# Patient Record
Sex: Female | Born: 1965 | Race: White | Hispanic: No | Marital: Married | Smoking: Former smoker
Health system: Southern US, Community
[De-identification: ages and names within clinical notes are randomized; demographics above are authoritative.]

## PROBLEM LIST (undated history)

## (undated) HISTORY — PX: TUBAL LIGATION: SHX77

## (undated) HISTORY — PX: DILATION AND CURETTAGE OF UTERUS: SHX78

---

## 2010-11-19 ENCOUNTER — Emergency Department (HOSPITAL_COMMUNITY)
Admission: EM | Admit: 2010-11-19 | Discharge: 2010-11-19 | Disposition: A | Discharge status: Home / Self Care | Payer: BC Managed Care – PPO | Attending: Emergency Medicine | Admitting: Emergency Medicine

## 2010-11-19 DIAGNOSIS — W64XXXA Exposure to other animate mechanical forces, initial encounter: Secondary | ICD-10-CM | POA: Insufficient documentation

## 2010-11-19 DIAGNOSIS — S058X9A Other injuries of unspecified eye and orbit, initial encounter: Secondary | ICD-10-CM | POA: Insufficient documentation

## 2011-09-08 ENCOUNTER — Emergency Department (HOSPITAL_COMMUNITY)
Admission: EM | Admit: 2011-09-08 | Discharge: 2011-09-09 | Disposition: A | Discharge status: Home / Self Care | Payer: BC Managed Care – PPO | Attending: Emergency Medicine | Admitting: Emergency Medicine

## 2011-09-08 ENCOUNTER — Encounter (HOSPITAL_COMMUNITY): Payer: Self-pay

## 2011-09-08 DIAGNOSIS — K5289 Other specified noninfective gastroenteritis and colitis: Secondary | ICD-10-CM | POA: Insufficient documentation

## 2011-09-08 DIAGNOSIS — K529 Noninfective gastroenteritis and colitis, unspecified: Secondary | ICD-10-CM

## 2011-09-08 DIAGNOSIS — R10819 Abdominal tenderness, unspecified site: Secondary | ICD-10-CM | POA: Insufficient documentation

## 2011-09-08 DIAGNOSIS — R109 Unspecified abdominal pain: Secondary | ICD-10-CM | POA: Insufficient documentation

## 2011-09-08 LAB — CBC
Hemoglobin: 15.5 g/dL — ABNORMAL HIGH (ref 12.0–15.0)
MCHC: 34 g/dL (ref 30.0–36.0)
RBC: 5.1 MIL/uL (ref 3.87–5.11)

## 2011-09-08 LAB — DIFFERENTIAL
Basophils Relative: 0 % (ref 0–1)
Monocytes Relative: 2 % — ABNORMAL LOW (ref 3–12)
Neutro Abs: 10.9 10*3/uL — ABNORMAL HIGH (ref 1.7–7.7)
Neutrophils Relative %: 91 % — ABNORMAL HIGH (ref 43–77)

## 2011-09-08 LAB — URINALYSIS, ROUTINE W REFLEX MICROSCOPIC
Glucose, UA: NEGATIVE mg/dL
Hgb urine dipstick: NEGATIVE
Ketones, ur: >80 mg/dL — AB
pH: 5.5 (ref 5.0–8.0)

## 2011-09-08 MED ORDER — SODIUM CHLORIDE 0.9 % IV SOLN
Freq: Once | INTRAVENOUS | Status: AC
  Administered 2011-09-08: via INTRAVENOUS

## 2011-09-08 MED ORDER — KETOROLAC TROMETHAMINE 30 MG/ML IJ SOLN
30.0000 mg | Freq: Once | INTRAMUSCULAR | Status: AC
  Administered 2011-09-08: 30 mg via INTRAVENOUS
  Filled 2011-09-08: qty 1

## 2011-09-08 MED ORDER — ONDANSETRON HCL 4 MG/2ML IJ SOLN
4.0000 mg | Freq: Once | INTRAMUSCULAR | Status: AC
  Administered 2011-09-08: 4 mg via INTRAVENOUS
  Filled 2011-09-08: qty 2

## 2011-09-08 NOTE — ED Provider Notes (Signed)
History   This chart was scribed for Geoffery Lyons, MD by Charolett Bumpers . The patient was seen in room APA10/[facility]10 and the patient's care was started at 11:09pm.   CSN: [id number]  Arrival date & time 09/08/11  2033   First MD Initiated Contact with Patient 09/08/11 2258      Chief Complaint  Patient presents with  . Vomiting  . Diarrhea  . Abdominal Pain  . Fever    (Consider location/radiation/quality/duration/timing/severity/associated sxs/prior treatment) The history is provided by the patient.   Adriana Weiss is a 46 y.o. female who presents to the Emergency Department complaining of constant, moderate generalized abdominal pain with associated n/v/d and fever since yesterday. Patient also reports decreased intake due to the inability to keep anything down. Patient denies any blood in stool or vomit. Patient reports some dysuria. Patient states that her abdominal pain is not localized. Patient states that she has a h/o gall bladder problems. Patient denies any known sick contacts.   No PCP   History reviewed. No pertinent past medical history.  Past Surgical History  Procedure Date  . Tubal ligation   . Dilation and curettage of uterus     No family history on file.  History  Substance Use Topics  . Smoking status: Current Everyday Smoker -- 1.5 packs/day  . Smokeless tobacco: Not on file  . Alcohol Use: No    OB History    Grav Para Term Preterm Abortions TAB SAB Ect Mult Living                  Review of Systems A complete 10 system review of systems was obtained and all systems are negative except as noted in the HPI and PMH.   Allergies  Erythromycin and Latex  Home Medications   Current Outpatient Rx  Name Route Sig Dispense Refill  . SIMETHICONE 125 MG PO CHEW Oral Chew 125 mg by mouth as needed. FOR CRAMPS AND BLOATING      BP 149/99  Pulse 121  Temp(Src) 97.8 F (36.6 C) (Oral)  Resp 22  Ht 5\' 6"  (1.676 m)  Wt 175 lb (79.379 kg)   BMI 28.25 kg/m2  SpO2 99%  LMP 08/08/2011  Physical Exam  Nursing note and vitals reviewed. Constitutional: She is oriented to person, place, and time. She appears well-developed and well-nourished. No distress.  HENT:  Head: Normocephalic and atraumatic.  Right Ear: External ear normal.  Left Ear: External ear normal.  Nose: Nose normal.  Mouth/Throat: Oropharynx is clear and moist.  Eyes: Conjunctivae and EOM are normal. Pupils are equal, round, and reactive to light.  Neck: Normal range of motion. Neck supple. No tracheal deviation present.  Cardiovascular: Normal rate, regular rhythm and normal heart sounds.  Exam reveals no gallop and no friction rub.   No murmur heard. Pulmonary/Chest: Effort normal and breath sounds normal. No respiratory distress. She has no wheezes.  Abdominal: Soft. Bowel sounds are normal. She exhibits no distension. There is tenderness. There is no rebound and no guarding.       Tenderness with palpitations in all quadrants.   Musculoskeletal: Normal range of motion. She exhibits no edema.  Neurological: She is alert and oriented to person, place, and time. No sensory deficit.  Skin: Skin is warm and dry.       Good skin turgor.   Psychiatric: She has a normal mood and affect. Her behavior is normal.    ED Course  Procedures (including critical  care time)  DIAGNOSTIC STUDIES: Oxygen Saturation is 99% on room air, normal by my interpretation.    COORDINATION OF CARE:  2314: Discussed planned course of treatment with the patient, who is agreeable at this time. Will order labs to determine if gall bladder related. Will order nausea and pain medication and IV fluids.  2315: Medication Orders: Ondansetron injection 4 mg-once; Ketorolac 30 mg/mL injection 30 mg-once; 0.9% sodium chloride infusion-once   Labs Reviewed - No data to display No results found.   No diagnosis found.    MDM  The labs look okay.  More likely AGE than biliary colic.   Will discharge to home with meds for pain and nausea.   I personally performed the services described in this documentation, which was scribed in my presence. The recorded information has been reviewed and considered.      Geoffery Lyons, MD 09/09/11 [phone]

## 2011-09-08 NOTE — ED Notes (Signed)
Pt presents with abdominal pain, n/v/d, and fever since yesterday.

## 2011-09-09 LAB — COMPREHENSIVE METABOLIC PANEL
ALT: 12 U/L (ref 0–35)
AST: 19 U/L (ref 0–37)
Albumin: 3.9 g/dL (ref 3.5–5.2)
Alkaline Phosphatase: 82 U/L (ref 39–117)
BUN: 18 mg/dL (ref 6–23)
Chloride: 102 mEq/L (ref 96–112)
Potassium: 4.2 mEq/L (ref 3.5–5.1)
Sodium: 137 mEq/L (ref 135–145)
Total Bilirubin: 0.3 mg/dL (ref 0.3–1.2)

## 2011-09-09 LAB — LIPASE, BLOOD: Lipase: 19 U/L (ref 11–59)

## 2011-09-09 MED ORDER — PROMETHAZINE HCL 25 MG PO TABS: 25.0000 mg | ORAL_TABLET | Freq: Four times a day (QID) | ORAL | Status: AC | PRN

## 2011-09-09 MED ORDER — ONDANSETRON HCL 4 MG/2ML IJ SOLN
INTRAMUSCULAR | Status: AC
  Filled 2011-09-09: qty 2

## 2011-09-09 MED ORDER — MORPHINE SULFATE 4 MG/ML IJ SOLN
4.0000 mg | Freq: Once | INTRAMUSCULAR | Status: AC
  Administered 2011-09-09: 4 mg via INTRAVENOUS

## 2011-09-09 MED ORDER — HYDROCODONE-ACETAMINOPHEN 5-500 MG PO TABS: 1.0000 | ORAL_TABLET | Freq: Four times a day (QID) | ORAL | Status: AC | PRN

## 2011-09-09 MED ORDER — ONDANSETRON HCL 4 MG/2ML IJ SOLN
4.0000 mg | Freq: Once | INTRAMUSCULAR | Status: AC
  Administered 2011-09-09: 4 mg via INTRAVENOUS

## 2011-09-09 MED ORDER — MORPHINE SULFATE 4 MG/ML IJ SOLN
INTRAMUSCULAR | Status: AC
  Filled 2011-09-09: qty 1

## 2011-09-09 NOTE — Discharge Instructions (Signed)

## 2011-09-09 NOTE — ED Notes (Signed)
Gave patient mouth swabs and cup of water for dry mouth as requested.

## 2017-10-28 ENCOUNTER — Emergency Department (HOSPITAL_COMMUNITY): Payer: PRIVATE HEALTH INSURANCE

## 2017-10-28 ENCOUNTER — Encounter (HOSPITAL_COMMUNITY): Payer: Self-pay

## 2017-10-28 ENCOUNTER — Emergency Department (HOSPITAL_COMMUNITY)
Admission: EM | Admit: 2017-10-28 | Discharge: 2017-10-28 | Disposition: A | Discharge status: Home / Self Care | Payer: PRIVATE HEALTH INSURANCE | Attending: Emergency Medicine | Admitting: Emergency Medicine

## 2017-10-28 ENCOUNTER — Other Ambulatory Visit: Payer: Self-pay

## 2017-10-28 DIAGNOSIS — Z87891 Personal history of nicotine dependence: Secondary | ICD-10-CM | POA: Insufficient documentation

## 2017-10-28 DIAGNOSIS — Y92411 Interstate highway as the place of occurrence of the external cause: Secondary | ICD-10-CM | POA: Diagnosis not present

## 2017-10-28 DIAGNOSIS — S40021A Contusion of right upper arm, initial encounter: Secondary | ICD-10-CM | POA: Insufficient documentation

## 2017-10-28 DIAGNOSIS — S8001XA Contusion of right knee, initial encounter: Secondary | ICD-10-CM | POA: Diagnosis not present

## 2017-10-28 DIAGNOSIS — S4991XA Unspecified injury of right shoulder and upper arm, initial encounter: Secondary | ICD-10-CM | POA: Diagnosis present

## 2017-10-28 DIAGNOSIS — Y939 Activity, unspecified: Secondary | ICD-10-CM | POA: Insufficient documentation

## 2017-10-28 DIAGNOSIS — Y999 Unspecified external cause status: Secondary | ICD-10-CM | POA: Insufficient documentation

## 2017-10-28 MED ORDER — CYCLOBENZAPRINE HCL 10 MG PO TABS
10.0000 mg | ORAL_TABLET | Freq: Once | ORAL | Status: AC
  Administered 2017-10-28: 10 mg via ORAL
  Filled 2017-10-28: qty 1

## 2017-10-28 MED ORDER — KETOROLAC TROMETHAMINE 60 MG/2ML IM SOLN
60.0000 mg | Freq: Once | INTRAMUSCULAR | Status: AC
  Administered 2017-10-28: 60 mg via INTRAMUSCULAR
  Filled 2017-10-28: qty 2

## 2017-10-28 MED ORDER — NAPROXEN 500 MG PO TABS: ORAL_TABLET | ORAL | 0 refills | Status: AC

## 2017-10-28 MED ORDER — TRAMADOL HCL 50 MG PO TABS: 100.0000 mg | ORAL_TABLET | Freq: Four times a day (QID) | ORAL | 0 refills | Status: AC | PRN

## 2017-10-28 MED ORDER — METHOCARBAMOL 500 MG PO TABS: ORAL_TABLET | ORAL | 0 refills | Status: AC

## 2017-10-28 NOTE — ED Notes (Signed)
Pt requesting another "pain shot" prior to being discharged. Dr Lynelle Doctor made aware.
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
## 2017-10-28 NOTE — Discharge Instructions (Addendum)
Ice packs to the injured or sore muscles for the next several days until the swelling and bruising is gone.  Take the medications for pain and muscle spasms. Take acetaminophen 1000 mg 4 times a day with the prescribed medication. Return to the ED for any problems listed on the head injury sheet. Recheck if you aren't improving in the next week by Dr Romeo Apple, the orthopedist on call in [place] or an orthopedist of your choice in [place], [place].
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
